# Patient Record
Sex: Male | Born: 1970 | Hispanic: No | Marital: Single | Smoking: Current every day smoker
Health system: Southern US, Community
[De-identification: ages and names within clinical notes are randomized; demographics above are authoritative.]

---

## 2019-12-29 ENCOUNTER — Emergency Department: Payer: Self-pay

## 2019-12-29 ENCOUNTER — Other Ambulatory Visit: Payer: Self-pay

## 2019-12-29 ENCOUNTER — Emergency Department
Admission: EM | Admit: 2019-12-29 | Discharge: 2019-12-29 | Disposition: A | Discharge status: Home / Self Care | Payer: Self-pay | Attending: Emergency Medicine | Admitting: Emergency Medicine

## 2019-12-29 DIAGNOSIS — M5136 Other intervertebral disc degeneration, lumbar region: Secondary | ICD-10-CM

## 2019-12-29 DIAGNOSIS — M5137 Other intervertebral disc degeneration, lumbosacral region: Secondary | ICD-10-CM

## 2019-12-29 DIAGNOSIS — M25552 Pain in left hip: Secondary | ICD-10-CM | POA: Insufficient documentation

## 2019-12-29 DIAGNOSIS — F17210 Nicotine dependence, cigarettes, uncomplicated: Secondary | ICD-10-CM | POA: Insufficient documentation

## 2019-12-29 DIAGNOSIS — M5187 Other intervertebral disc disorders, lumbosacral region: Secondary | ICD-10-CM | POA: Insufficient documentation

## 2019-12-29 MED ORDER — METHOCARBAMOL 500 MG PO TABS: 500.0000 mg | ORAL_TABLET | Freq: Four times a day (QID) | ORAL | 0 refills | Status: AC

## 2019-12-29 MED ORDER — HYDROCODONE-ACETAMINOPHEN 5-325 MG PO TABS: 1.0000 | ORAL_TABLET | Freq: Four times a day (QID) | ORAL | 0 refills | Status: AC | PRN

## 2019-12-29 MED ORDER — METHOCARBAMOL 500 MG PO TABS
750.0000 mg | ORAL_TABLET | Freq: Once | ORAL | Status: AC
  Administered 2019-12-29: 750 mg via ORAL
  Filled 2019-12-29: qty 2

## 2019-12-29 MED ORDER — MELOXICAM 15 MG PO TABS
15.0000 mg | ORAL_TABLET | Freq: Every day | ORAL | 0 refills | Status: AC
Start: 2019-12-29 — End: ?

## 2019-12-29 MED ORDER — KETOROLAC TROMETHAMINE 60 MG/2ML IM SOLN
30.0000 mg | Freq: Once | INTRAMUSCULAR | Status: AC
  Administered 2019-12-29: 30 mg via INTRAMUSCULAR
  Filled 2019-12-29: qty 2

## 2019-12-29 NOTE — ED Triage Notes (Signed)
Pt c/o left hip pain "for a while" states in the past 2-3 days it has become unbearable . Denies injury

## 2019-12-29 NOTE — ED Provider Notes (Signed)
[facility] Emergency Department Provider Note ____________________________________________  Time seen: Approximately 10:21 AM  I have reviewed the triage vital signs and the nursing notes.   HISTORY  Chief Complaint Hip Pain    HPI Mario Gibson is a 49 y.o. male who presents to the emergency department for evaluation and treatment of left hip and groin pain. No injury. He works as a Curator at [employer]. He reports lots of squatting, bending, and climbing stairs. No relief with Tylenol Back and Body.    History reviewed. No pertinent past medical history.  There are no problems to display for this patient.   History reviewed. No pertinent surgical history.  Prior to Admission medications   Medication Sig Start Date End Date Taking? Authorizing Provider  HYDROcodone-acetaminophen (NORCO/VICODIN) 5-325 MG tablet Take 1 tablet by mouth every 6 (six) hours as needed for up to 3 days for severe pain. 12/29/19 01/01/20  Chanika Byland, Rulon Eisenmenger B, FNP  meloxicam (MOBIC) 15 MG tablet Take 1 tablet (15 mg total) by mouth daily. 12/29/19   Shyanna Klingel, Rulon Eisenmenger B, FNP  methocarbamol (ROBAXIN) 500 MG tablet Take 1 tablet (500 mg total) by mouth 4 (four) times daily. 12/29/19   Chinita Pester, FNP    Allergies Patient has no known allergies.  No family history on file.  Social History Social History   Tobacco Use   Smoking status: Current Every Day Smoker    Types: Cigarettes   Smokeless tobacco: Never Used  Substance Use Topics   Alcohol use: Yes   Drug use: Not Currently    Review of Systems Constitutional: Negative for fever. Cardiovascular: Negative for chest pain. Respiratory: Negative for shortness of breath. Musculoskeletal: Positive for left hip and groin pain. Skin: Negative for open wounds or lesions.  Neurological: Negative for decrease in sensation  ____________________________________________   PHYSICAL EXAM:  VITAL SIGNS: ED Triage Vitals   Enc Vitals Group     BP 12/29/19 0959 115/82     Pulse Rate 12/29/19 0959 90     Resp 12/29/19 0959 17     Temp 12/29/19 0959 98.5 F (36.9 C)     Temp Source 12/29/19 0959 Oral     SpO2 12/29/19 0959 100 %     Weight 12/29/19 1000 150 lb (68 kg)     Height 12/29/19 1000 5\' 10"  (1.778 m)     Head Circumference --      Peak Flow --      Pain Score 12/29/19 1000 10     Pain Loc --      Pain Edu? --      Excl. in GC? --     Constitutional: Alert and oriented. Well appearing and in no acute distress. Eyes: Conjunctivae are clear without discharge or drainage Head: Atraumatic Neck: Supple Respiratory: No cough. Respirations are even and unlabored. Musculoskeletal: Pain in left groin with flexion of left knee. Pain in hip increases with internal and external rotation. Neurologic: Awake, alert, oriented. Motor and sensory function intact.  Skin: Negative for open wounds or lesions.  Psychiatric: Affect and behavior are appropriate.  ____________________________________________   LABS (all labs ordered are listed, but only abnormal results are displayed)  Labs Reviewed - No data to display ____________________________________________  RADIOLOGY  X-ray of the lumbar spine shows moderate to severe degenerative disc disease and spondylosis at L5-S1.  Image of the left hip is negative for acute findings.  I, 12/31/19, personally viewed and evaluated these images (plain radiographs) as  part of my medical decision making, as well as reviewing the written report by the radiologist.  DG Lumbar Spine 2-3 Views  Result Date: 12/29/2019 CLINICAL DATA:  49 year old male with low back pain radiating to LEFT hip for 3 months. EXAM: LUMBAR SPINE - 2-3 VIEW COMPARISON:  None. FINDINGS: Five non rib-bearing lumbar type vertebra are identified in normal alignment. No fracture or subluxation identified. Moderate to severe degenerative disc disease/spondylosis at L5-S1 noted. Mild spondylosis  throughout the lumbar spine otherwise noted. No focal bony lesions are present. IMPRESSION: 1. No evidence of acute abnormality. 2. Moderate to severe degenerative disc disease/spondylosis at L5-S1. Electronically Signed   By: Harmon Pier M.D.   On: 12/29/2019 11:21   DG Hip Unilat W or Wo Pelvis 2-3 Views Left  Result Date: 12/29/2019 CLINICAL DATA:  LEFT hip pain for 3-4 months.  Initial encounter. EXAM: DG HIP (WITH OR WITHOUT PELVIS) 2-3V LEFT COMPARISON:  None. FINDINGS: There is no evidence of hip fracture or dislocation. There is no evidence of arthropathy or other focal bone abnormality. IMPRESSION: Negative. Electronically Signed   By: Harmon Pier M.D.   On: 12/29/2019 11:22   ____________________________________________   PROCEDURES  Procedures  ____________________________________________   INITIAL IMPRESSION / ASSESSMENT AND PLAN / ED COURSE  Mario Gibson is a 49 y.o. who presents to the emergency department for treatment and evaluation of left hip pain.  Patient states that the pain has been ongoing for several months but over the past 2 or 3 days it has become severe.  No injury.  See HPI for further details.  Patient also states that he does have some pinched nerves in his lower back and is not sure if this is what is causing his pain currently.  Plan will be to get images of the lumbar spine and hip.   Differential diagnosis includes but is not limited to: Lumbar compression fracture, lumbar degeneration, avascular necrosis, tendinitis.  Image of the lumbar spine shows moderate to severe degenerative disc disease and spondylosis at L5-S1.  Hip is negative for acute concerns.  Patient will be treated with meloxicam, Robaxin, and a few tablets of Norco.  He was advised not to work or drive while taking the muscle relaxer or the Norco.  He is to take the meloxicam daily.  He is to call and schedule follow-up appointment with orthopedics.  He is also advised to wear a back brace while  at work.  For symptoms of change or worsen if he is unable to schedule an appointment with primary care or orthopedics, he is to return to the emergency department.  Medications  methocarbamol (ROBAXIN) tablet 750 mg (750 mg Oral Given 12/29/19 1113)  ketorolac (TORADOL) injection 30 mg (30 mg Intramuscular Given 12/29/19 1113)    Pertinent labs & imaging results that were available during my care of the patient were reviewed by me and considered in my medical decision making (see chart for details).   _________________________________________   FINAL CLINICAL IMPRESSION(S) / ED DIAGNOSES  Final diagnoses:  Acute pain of left hip  Degenerative disc disease at L5-S1 level    ED Discharge Orders         Ordered    methocarbamol (ROBAXIN) 500 MG tablet  4 times daily     Discontinue  Reprint     12/29/19 1141    meloxicam (MOBIC) 15 MG tablet  Daily     Discontinue  Reprint     12/29/19 1141  HYDROcodone-acetaminophen (NORCO/VICODIN) 5-325 MG tablet  Every 6 hours PRN     Discontinue  Reprint     12/29/19 1141           If controlled substance prescribed during this visit, 12 month history viewed on the [facility] prior to issuing an initial prescription for Schedule II or III opiod.   Chinita Pester, FNP 12/29/19 1150    Emily Filbert, MD 12/29/19 1221

## 2019-12-29 NOTE — ED Notes (Signed)
See triage note  Presents with pain to left hip and into groin area  States pain states couple of months ago  But denies any injury ambulates with slight limp d/t pain

## 2019-12-29 NOTE — Discharge Instructions (Signed)
Wear a back brace while at work.   Do not take your pain medication or muscle relaxer while working. Take your meloxicam daily.  Follow up with orthopedics.   Return to the ER for symptoms that change or worsen or for new concerns if unable to see PCP or orthopedics.

## 2021-06-01 IMAGING — CR DG LUMBAR SPINE 2-3V
1 series · 3 of 3 positions shown · non-contrast
Comparison: None.

CLINICAL DATA: 49-year-old male with low back pain radiating to
LEFT hip for 3 months.

EXAM:
LUMBAR SPINE - 2-3 VIEW

[Series 1: dg lumbar spine 2-3 views · 0.14mm/px · 3 of 3 slices shown]
[im 1/3]
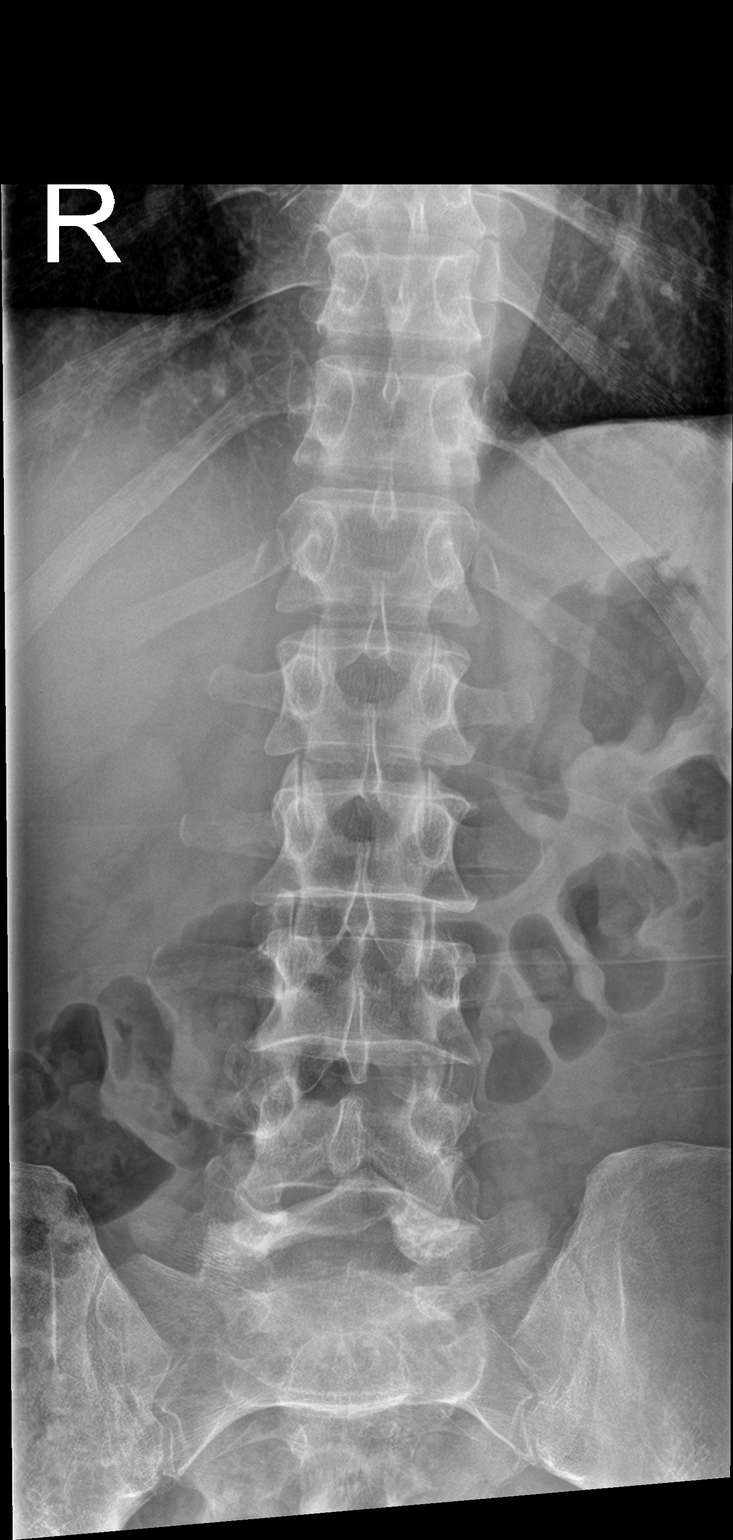
[im 2/3]
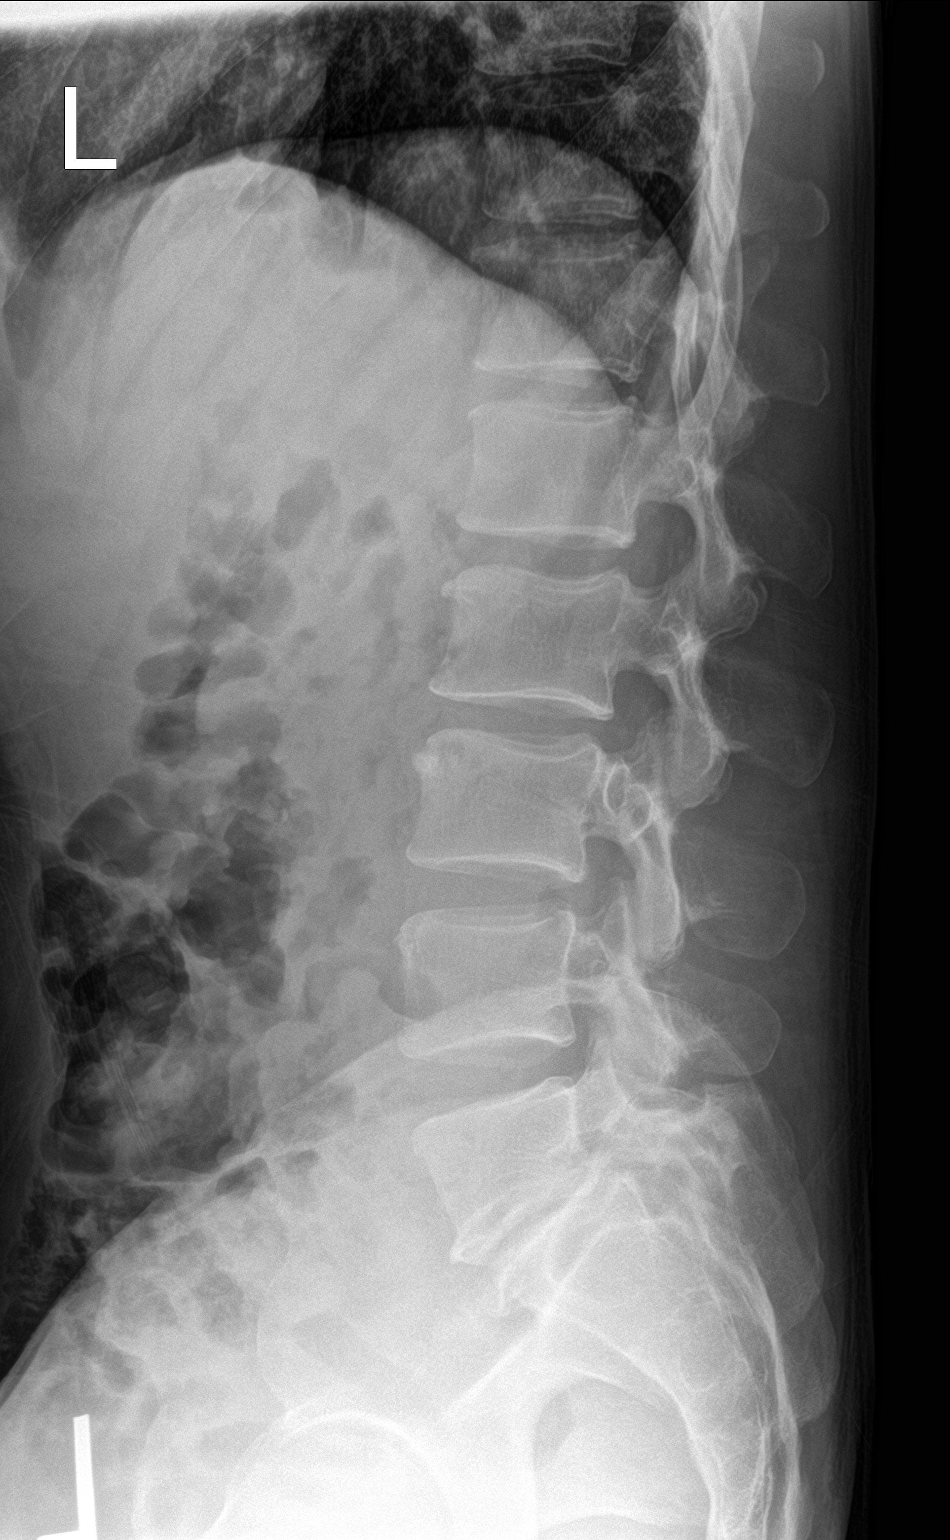
[im 3/3]
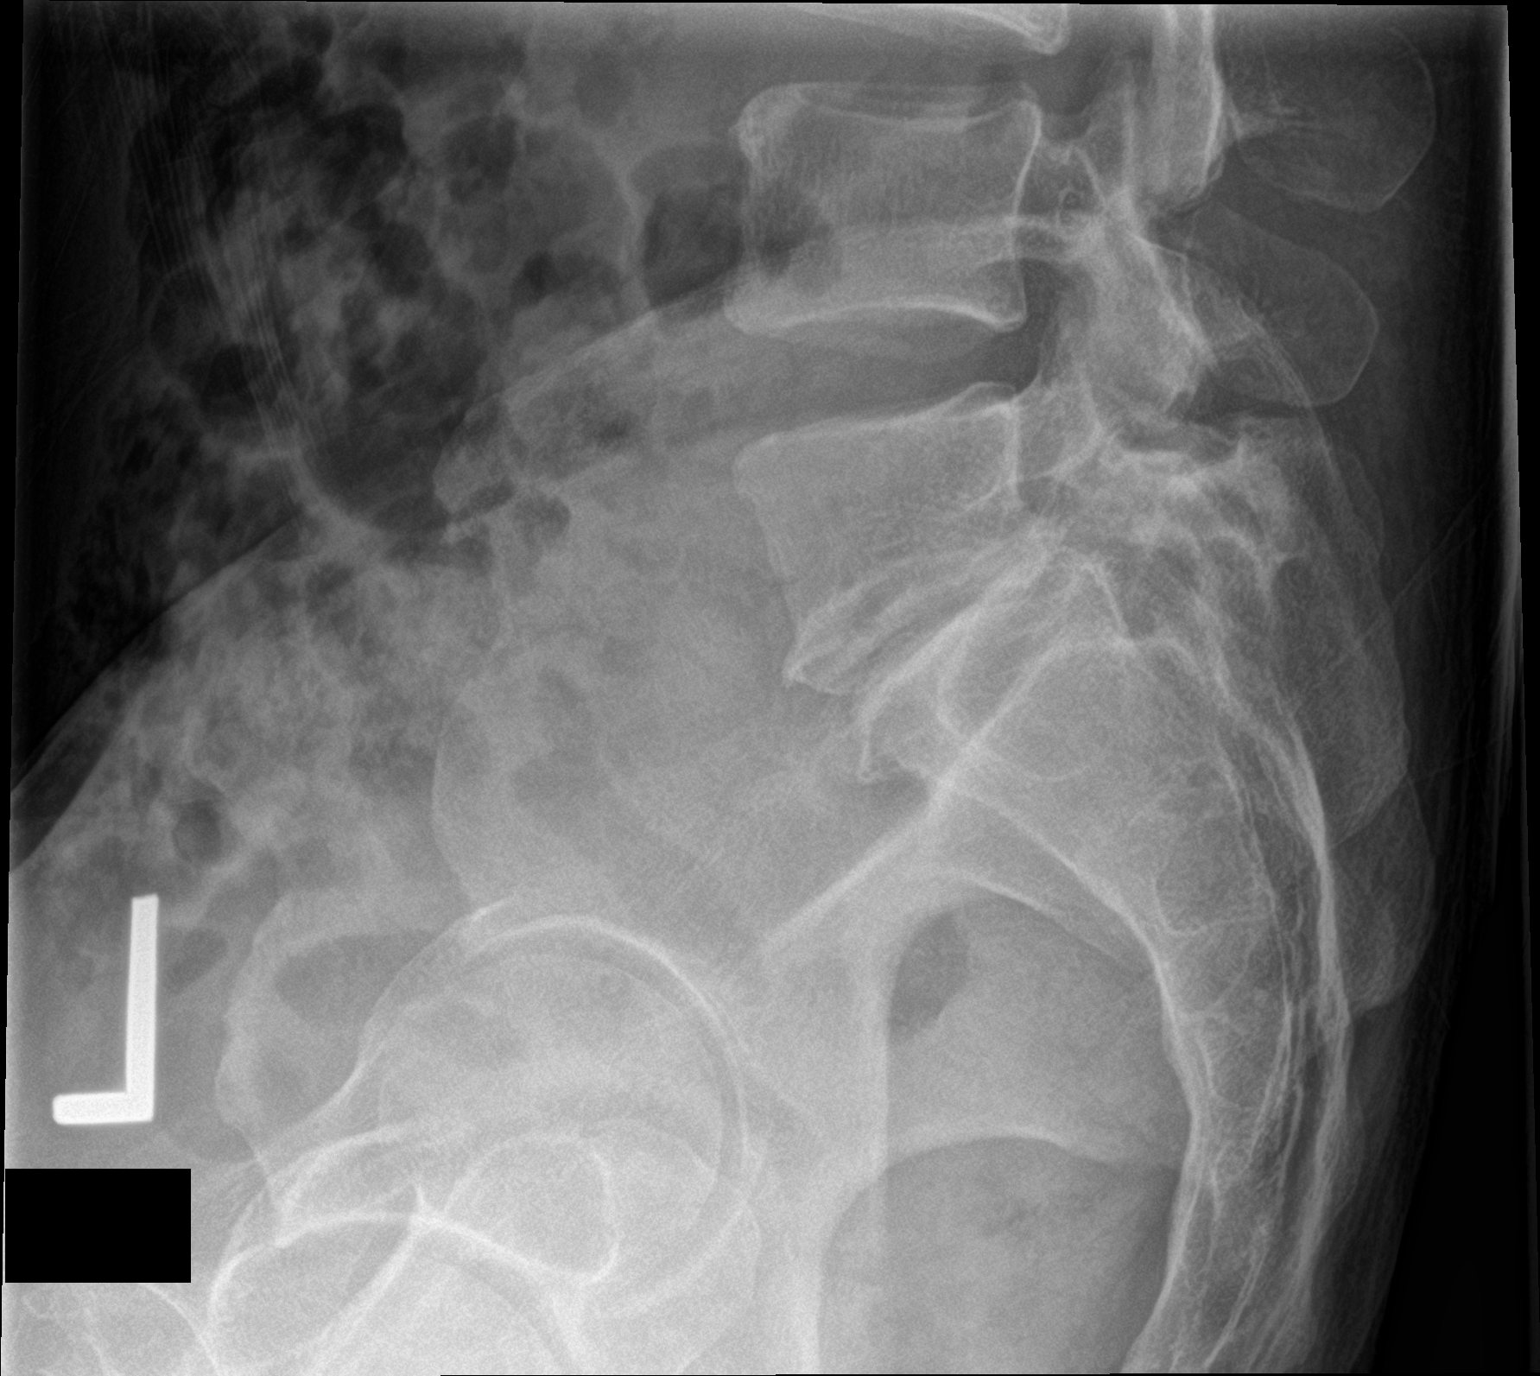

[3 of 3 positions shown; findings below may reference images not displayed]

FINDINGS: Five non rib-bearing lumbar type vertebra are identified in normal
alignment.

No fracture or subluxation identified.

Moderate to severe degenerative disc disease/spondylosis at L5-S1
noted.

Mild spondylosis throughout the lumbar spine otherwise noted.

No focal bony lesions are present.
IMPRESSION: 1. No evidence of acute abnormality.
2. Moderate to severe degenerative disc disease/spondylosis at
L5-S1.
